# Patient Record
Sex: Female | Born: 1940 | Race: Asian | Hispanic: No | Marital: Married
Health system: Southern US, Community
[De-identification: ages and names within clinical notes are randomized; demographics above are authoritative.]

---

## 2010-12-21 ENCOUNTER — Other Ambulatory Visit: Payer: Self-pay | Admitting: Family Medicine

## 2010-12-21 DIAGNOSIS — Z780 Asymptomatic menopausal state: Secondary | ICD-10-CM

## 2010-12-21 DIAGNOSIS — Z1231 Encounter for screening mammogram for malignant neoplasm of breast: Secondary | ICD-10-CM

## 2010-12-30 ENCOUNTER — Ambulatory Visit
Admission: RE | Admit: 2010-12-30 | Discharge: 2010-12-30 | Disposition: A | Discharge status: Home / Self Care | Payer: Medicare Other | Source: Ambulatory Visit | Attending: Family Medicine | Admitting: Family Medicine

## 2010-12-30 DIAGNOSIS — Z1231 Encounter for screening mammogram for malignant neoplasm of breast: Secondary | ICD-10-CM

## 2010-12-30 DIAGNOSIS — Z780 Asymptomatic menopausal state: Secondary | ICD-10-CM

## 2011-12-14 ENCOUNTER — Other Ambulatory Visit: Payer: Self-pay | Admitting: Family Medicine

## 2011-12-14 DIAGNOSIS — Z1231 Encounter for screening mammogram for malignant neoplasm of breast: Secondary | ICD-10-CM

## 2011-12-31 ENCOUNTER — Ambulatory Visit
Admission: RE | Admit: 2011-12-31 | Discharge: 2011-12-31 | Disposition: A | Discharge status: Home / Self Care | Payer: Medicare Other | Source: Ambulatory Visit | Attending: Family Medicine | Admitting: Family Medicine

## 2011-12-31 DIAGNOSIS — Z1231 Encounter for screening mammogram for malignant neoplasm of breast: Secondary | ICD-10-CM

## 2012-11-29 ENCOUNTER — Other Ambulatory Visit: Payer: Self-pay

## 2012-11-29 DIAGNOSIS — Z1231 Encounter for screening mammogram for malignant neoplasm of breast: Secondary | ICD-10-CM

## 2013-01-01 ENCOUNTER — Ambulatory Visit
Admission: RE | Admit: 2013-01-01 | Discharge: 2013-01-01 | Disposition: A | Discharge status: Home / Self Care | Payer: Medicare Other | Source: Ambulatory Visit

## 2013-01-01 DIAGNOSIS — Z1231 Encounter for screening mammogram for malignant neoplasm of breast: Secondary | ICD-10-CM

## 2013-10-09 ENCOUNTER — Other Ambulatory Visit: Payer: Self-pay | Admitting: Family Medicine

## 2013-10-09 DIAGNOSIS — Z1231 Encounter for screening mammogram for malignant neoplasm of breast: Secondary | ICD-10-CM

## 2013-10-09 DIAGNOSIS — E2839 Other primary ovarian failure: Secondary | ICD-10-CM

## 2014-01-02 ENCOUNTER — Encounter (INDEPENDENT_AMBULATORY_CARE_PROVIDER_SITE_OTHER): Payer: Self-pay

## 2014-01-02 ENCOUNTER — Ambulatory Visit
Admission: RE | Admit: 2014-01-02 | Discharge: 2014-01-02 | Disposition: A | Discharge status: Home / Self Care | Payer: Medicare Other | Source: Ambulatory Visit | Attending: Family Medicine | Admitting: Family Medicine

## 2014-01-02 DIAGNOSIS — Z1231 Encounter for screening mammogram for malignant neoplasm of breast: Secondary | ICD-10-CM

## 2014-01-02 DIAGNOSIS — E2839 Other primary ovarian failure: Secondary | ICD-10-CM

## 2014-12-16 ENCOUNTER — Other Ambulatory Visit: Payer: Self-pay

## 2014-12-16 DIAGNOSIS — Z1231 Encounter for screening mammogram for malignant neoplasm of breast: Secondary | ICD-10-CM

## 2015-01-14 ENCOUNTER — Ambulatory Visit
Admission: RE | Admit: 2015-01-14 | Discharge: 2015-01-14 | Disposition: A | Discharge status: Home / Self Care | Payer: Medicare Other | Source: Ambulatory Visit

## 2015-01-14 DIAGNOSIS — Z1231 Encounter for screening mammogram for malignant neoplasm of breast: Secondary | ICD-10-CM

## 2015-12-16 ENCOUNTER — Other Ambulatory Visit: Payer: Self-pay | Admitting: Family Medicine

## 2015-12-16 DIAGNOSIS — Z139 Encounter for screening, unspecified: Secondary | ICD-10-CM

## 2016-01-19 ENCOUNTER — Ambulatory Visit
Admission: RE | Admit: 2016-01-19 | Discharge: 2016-01-19 | Disposition: A | Discharge status: Home / Self Care | Payer: Medicare Other | Source: Ambulatory Visit | Attending: Family Medicine | Admitting: Family Medicine

## 2016-01-19 DIAGNOSIS — Z139 Encounter for screening, unspecified: Secondary | ICD-10-CM

## 2016-12-28 ENCOUNTER — Other Ambulatory Visit: Payer: Self-pay | Admitting: Family Medicine

## 2016-12-28 DIAGNOSIS — Z1231 Encounter for screening mammogram for malignant neoplasm of breast: Secondary | ICD-10-CM

## 2017-01-19 ENCOUNTER — Ambulatory Visit: Payer: Medicare Other

## 2017-02-01 ENCOUNTER — Ambulatory Visit
Admission: RE | Admit: 2017-02-01 | Discharge: 2017-02-01 | Disposition: A | Discharge status: Home / Self Care | Payer: Medicare Other | Source: Ambulatory Visit | Attending: Family Medicine | Admitting: Family Medicine

## 2017-02-01 DIAGNOSIS — Z1231 Encounter for screening mammogram for malignant neoplasm of breast: Secondary | ICD-10-CM

## 2017-12-22 ENCOUNTER — Ambulatory Visit
Admission: RE | Admit: 2017-12-22 | Discharge: 2017-12-22 | Disposition: A | Discharge status: Home / Self Care | Payer: Medicare Other | Source: Ambulatory Visit | Attending: Family Medicine | Admitting: Family Medicine

## 2017-12-22 ENCOUNTER — Other Ambulatory Visit: Payer: Self-pay | Admitting: Family Medicine

## 2017-12-22 DIAGNOSIS — M170 Bilateral primary osteoarthritis of knee: Secondary | ICD-10-CM

## 2018-03-08 ENCOUNTER — Other Ambulatory Visit: Payer: Self-pay | Admitting: Family Medicine

## 2018-03-08 DIAGNOSIS — Z1231 Encounter for screening mammogram for malignant neoplasm of breast: Secondary | ICD-10-CM

## 2018-03-16 ENCOUNTER — Other Ambulatory Visit: Payer: Self-pay | Admitting: Family Medicine

## 2018-03-16 DIAGNOSIS — E2839 Other primary ovarian failure: Secondary | ICD-10-CM

## 2018-03-16 DIAGNOSIS — R5381 Other malaise: Secondary | ICD-10-CM

## 2018-04-11 ENCOUNTER — Ambulatory Visit
Admission: RE | Admit: 2018-04-11 | Discharge: 2018-04-11 | Disposition: A | Discharge status: Home / Self Care | Payer: Medicare Other | Source: Ambulatory Visit | Attending: Family Medicine | Admitting: Family Medicine

## 2018-04-11 DIAGNOSIS — Z1231 Encounter for screening mammogram for malignant neoplasm of breast: Secondary | ICD-10-CM

## 2018-05-19 ENCOUNTER — Ambulatory Visit
Admission: RE | Admit: 2018-05-19 | Discharge: 2018-05-19 | Disposition: A | Discharge status: Home / Self Care | Payer: Medicare Other | Source: Ambulatory Visit | Attending: Family Medicine | Admitting: Family Medicine

## 2018-05-19 DIAGNOSIS — E2839 Other primary ovarian failure: Secondary | ICD-10-CM

## 2019-08-17 IMAGING — CR DG KNEE COMPLETE 4+V*R*
4 series · 4 of 4 positions shown · non-contrast
Comparison: None.

CLINICAL DATA: BILATERAL medial knee pain, more pain on the RIGHT
for 3 weeks. No history of injury.

EXAM:
RIGHT KNEE - COMPLETE 4+ VIEW

[t knee ap right]
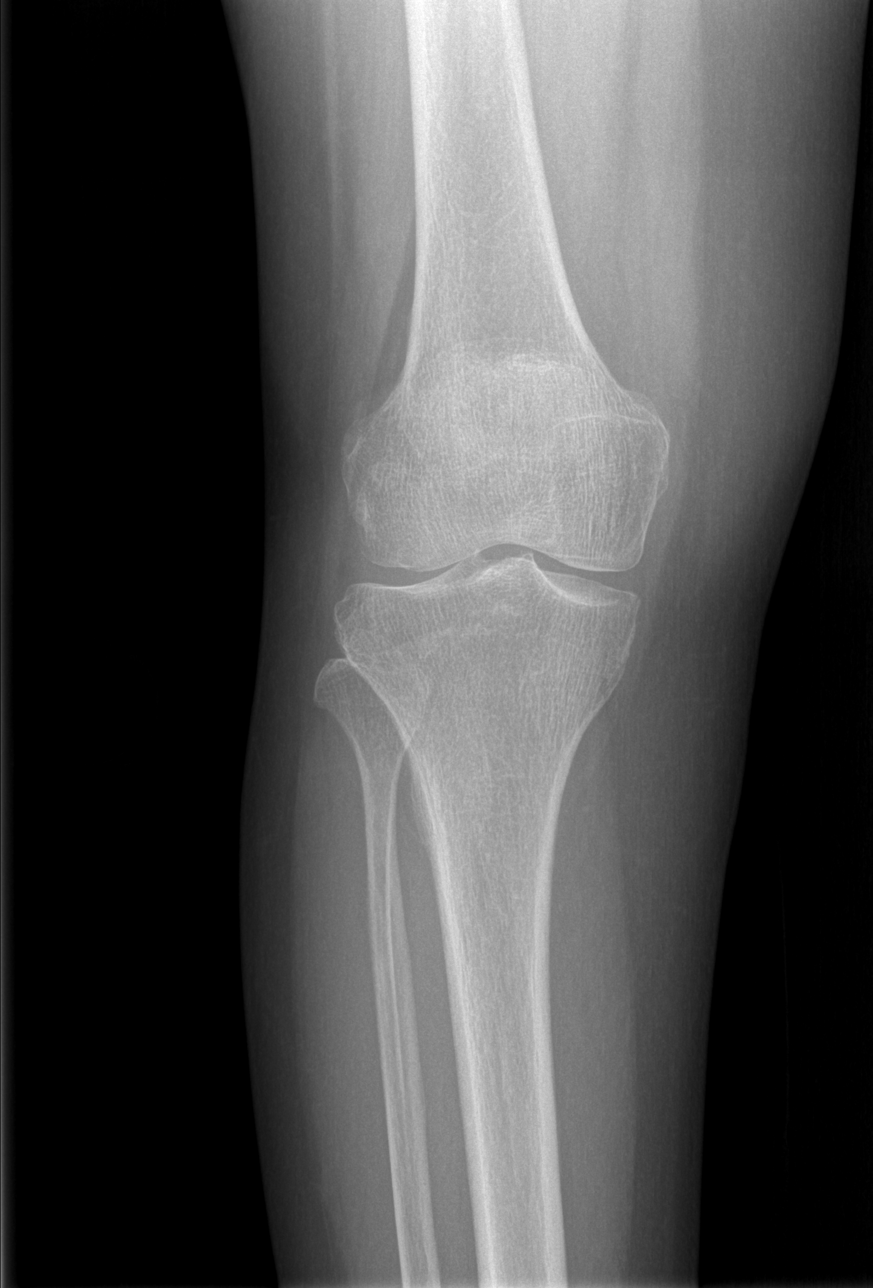

[t knee oblique right (1 of 2)]
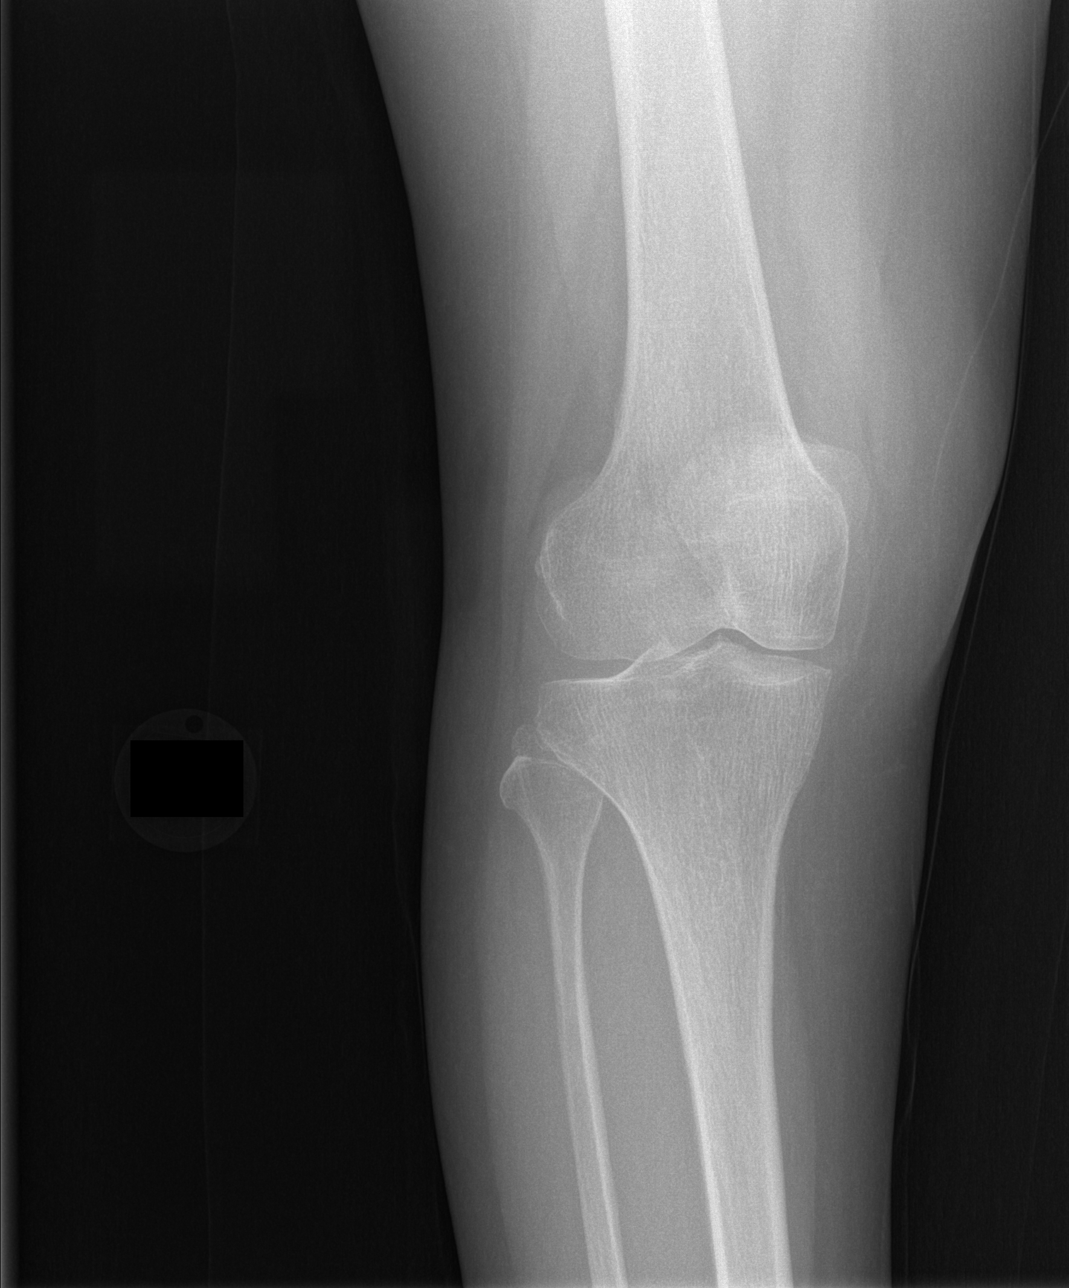

[t knee oblique right (2 of 2)]
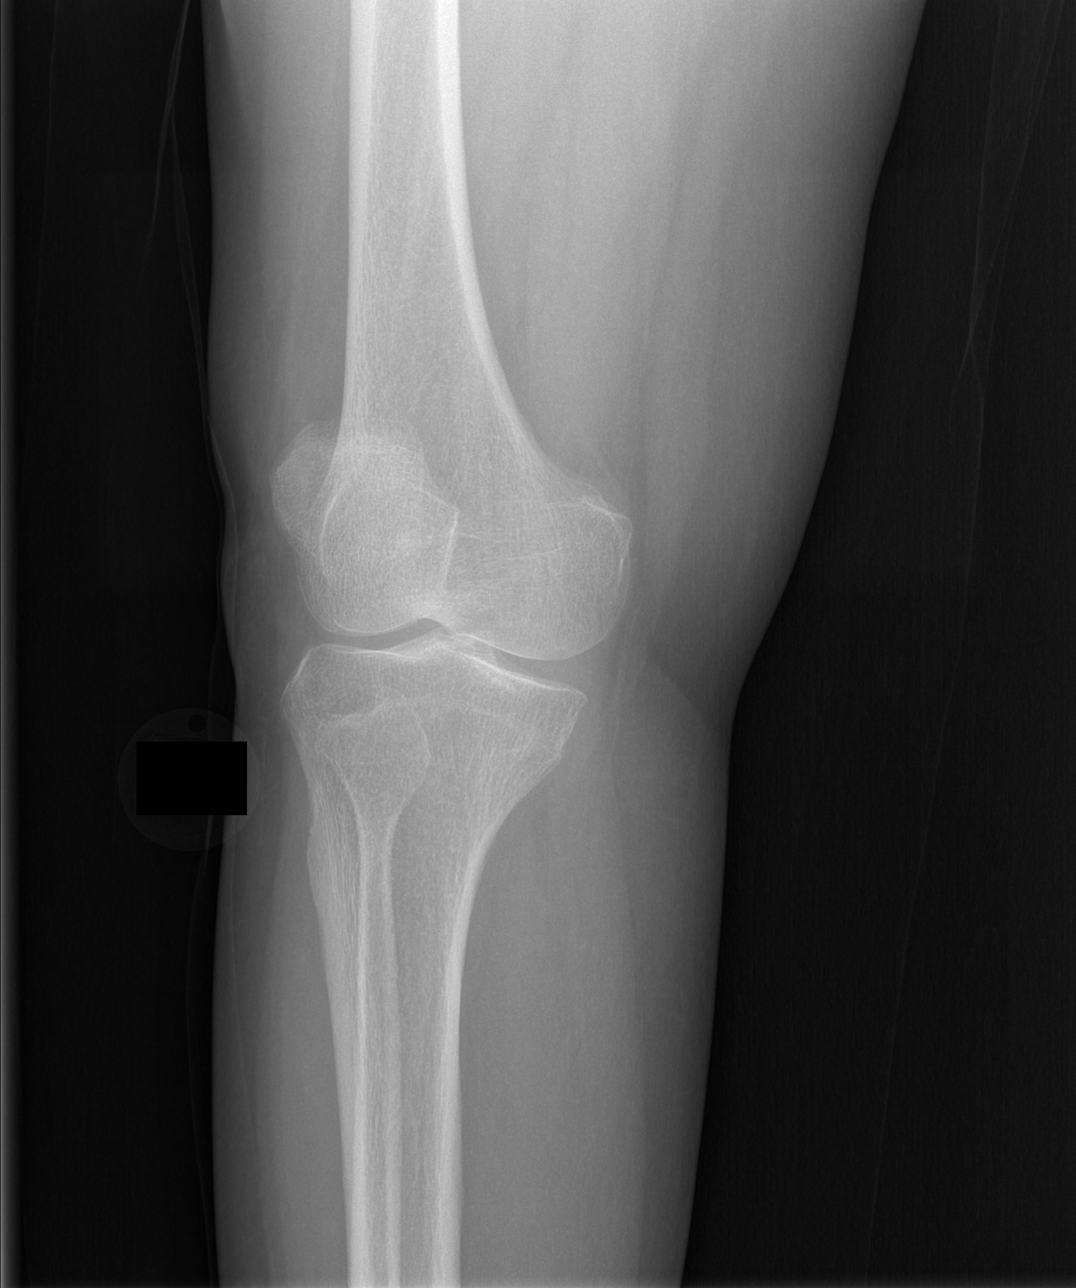

[t knee lat right]
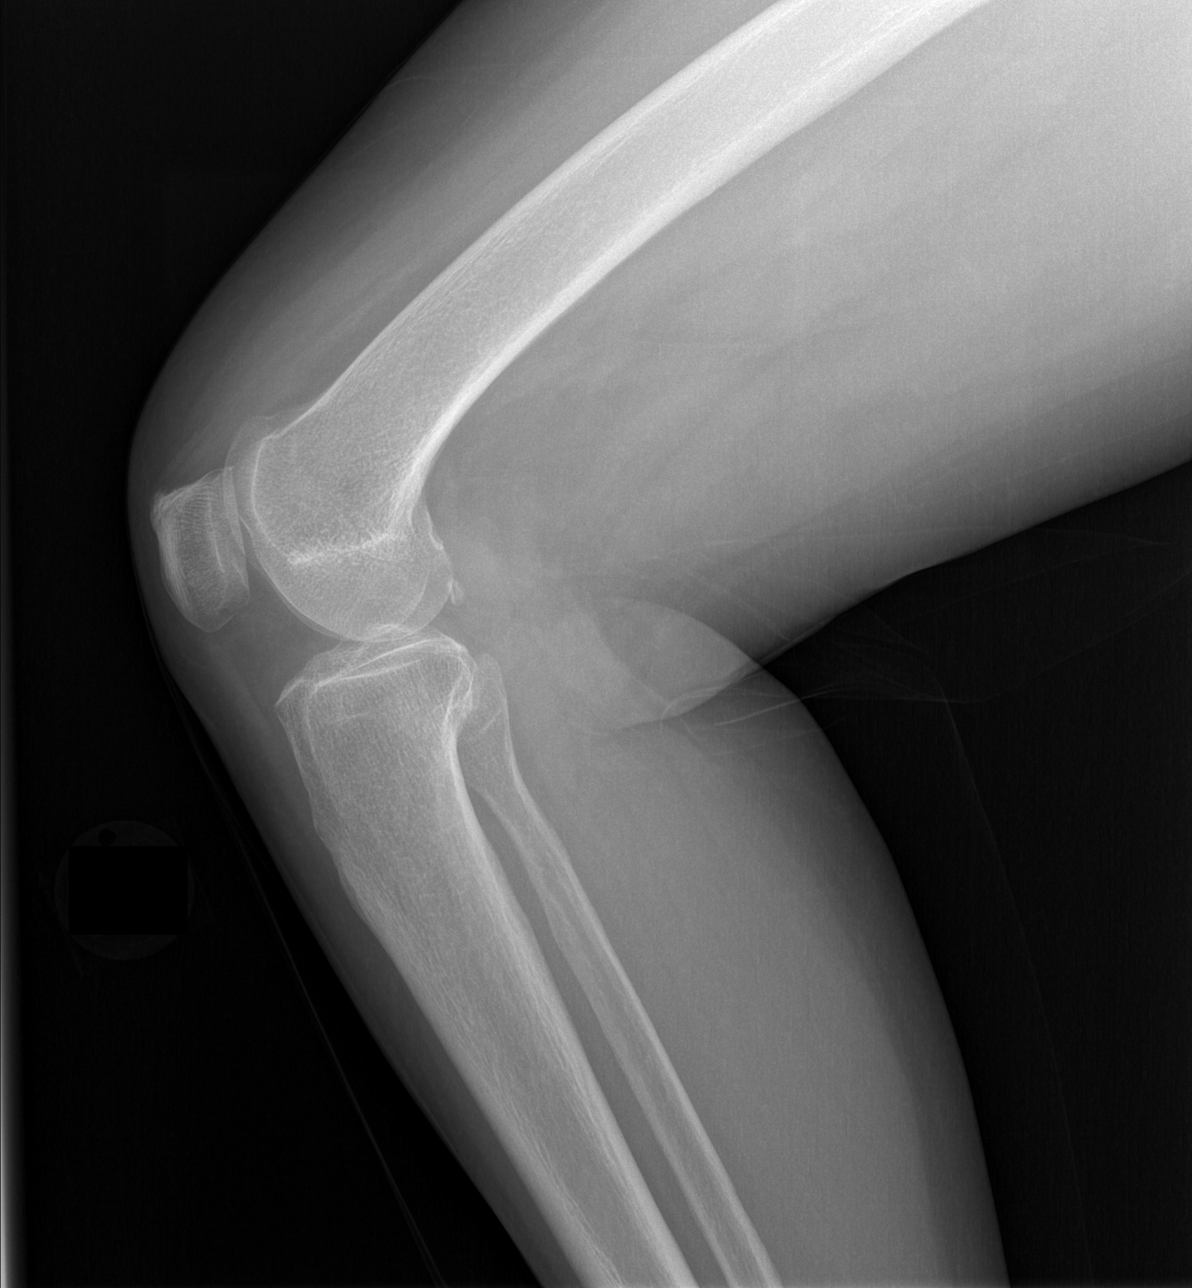

[4 of 4 positions shown; findings below may reference images not displayed]

FINDINGS: No evidence of fracture, dislocation, or joint effusion. Mild
narrowing of the lateral and patellofemoral compartments. Mild
patellar spurring. Soft tissues are unremarkable.
IMPRESSION: RIGHT knee osteoarthritis.

## 2020-03-27 ENCOUNTER — Other Ambulatory Visit: Payer: Self-pay | Admitting: Family Medicine

## 2020-03-27 ENCOUNTER — Other Ambulatory Visit: Payer: Self-pay

## 2020-03-27 ENCOUNTER — Ambulatory Visit
Admission: RE | Admit: 2020-03-27 | Discharge: 2020-03-27 | Disposition: A | Discharge status: Home / Self Care | Payer: Medicare Other | Source: Ambulatory Visit | Attending: Family Medicine | Admitting: Family Medicine

## 2020-03-27 DIAGNOSIS — M25562 Pain in left knee: Secondary | ICD-10-CM

## 2021-11-10 ENCOUNTER — Other Ambulatory Visit: Payer: Self-pay | Admitting: Family Medicine

## 2021-11-10 ENCOUNTER — Ambulatory Visit
Admission: RE | Admit: 2021-11-10 | Discharge: 2021-11-10 | Disposition: A | Discharge status: Home / Self Care | Payer: Medicare Other | Source: Ambulatory Visit | Attending: Family Medicine | Admitting: Family Medicine

## 2021-11-10 DIAGNOSIS — E70319 Ocular albinism, unspecified: Secondary | ICD-10-CM

## 2021-11-10 DIAGNOSIS — M179 Osteoarthritis of knee, unspecified: Secondary | ICD-10-CM

## 2021-11-20 IMAGING — CR DG KNEE COMPLETE 4+V*R*
4 series · 4 of 4 positions shown · non-contrast
Comparison: 12/22/2017.

CLINICAL DATA: Knee pain for 1 month. No injury. Pain with bending.

EXAM:
RIGHT KNEE - COMPLETE 4+ VIEW

[t knee ap right]
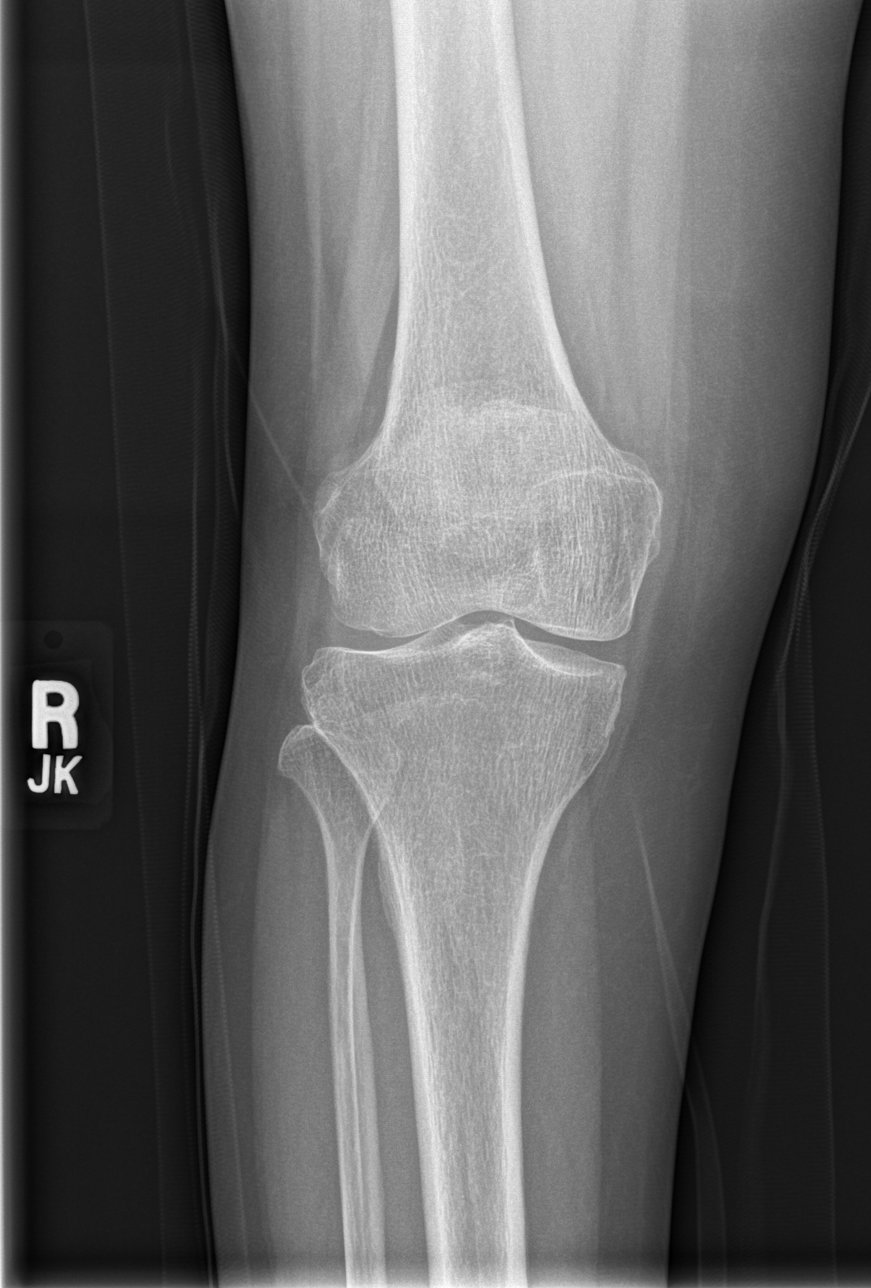

[t knee obl right (1 of 3)]
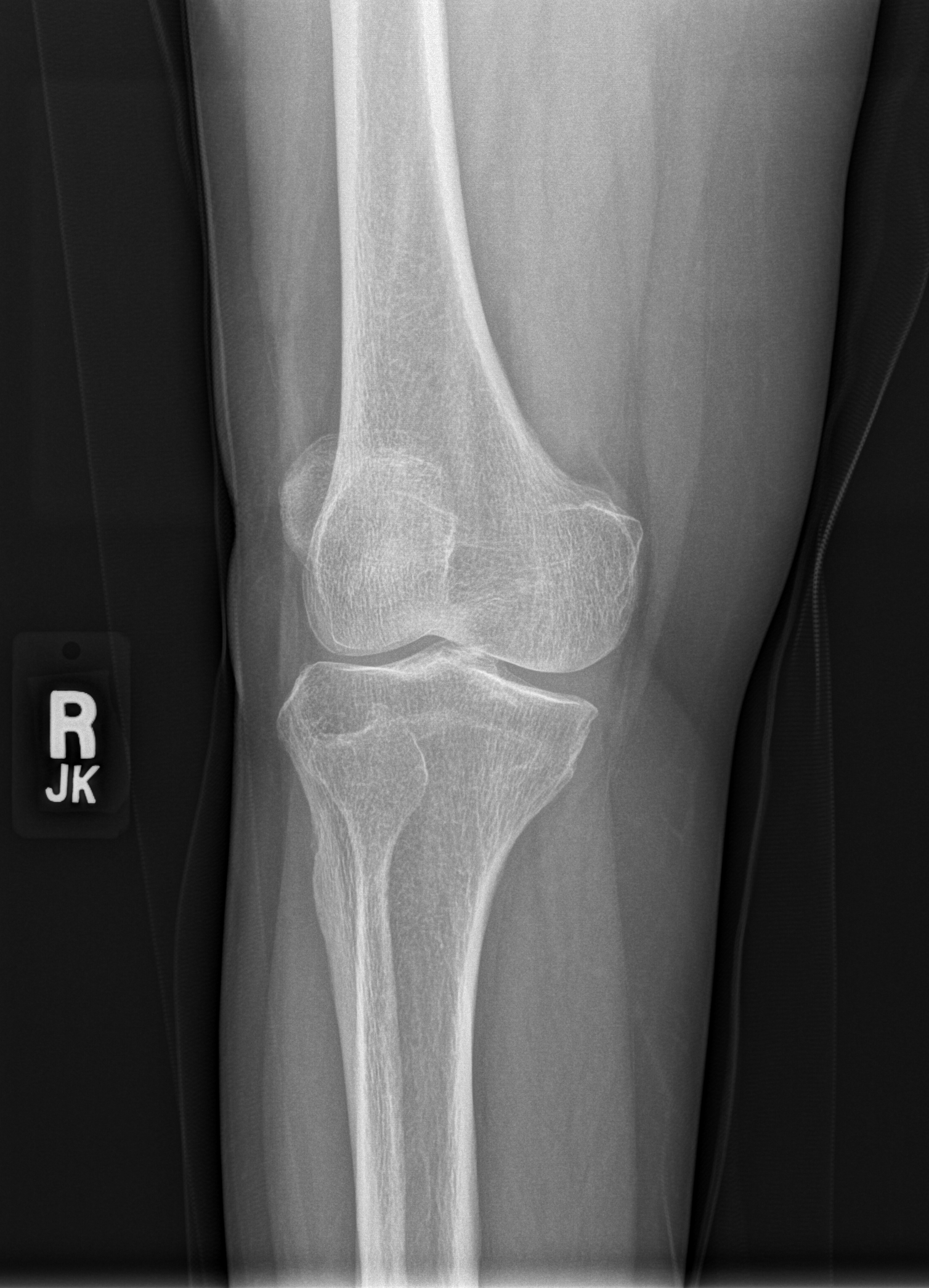

[t knee obl right (2 of 3)]
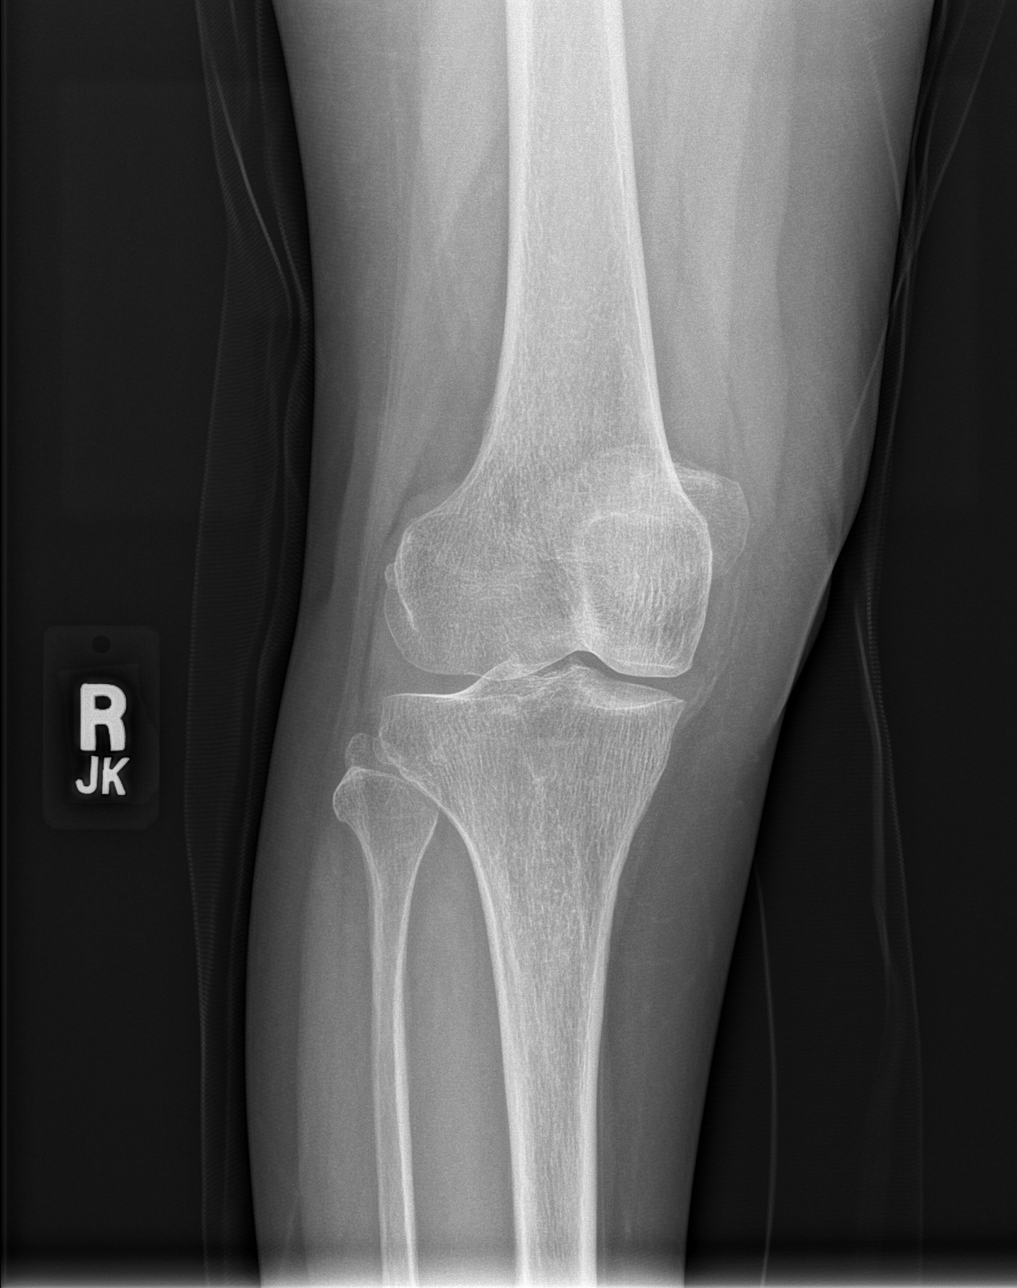

[t knee obl right (3 of 3)]
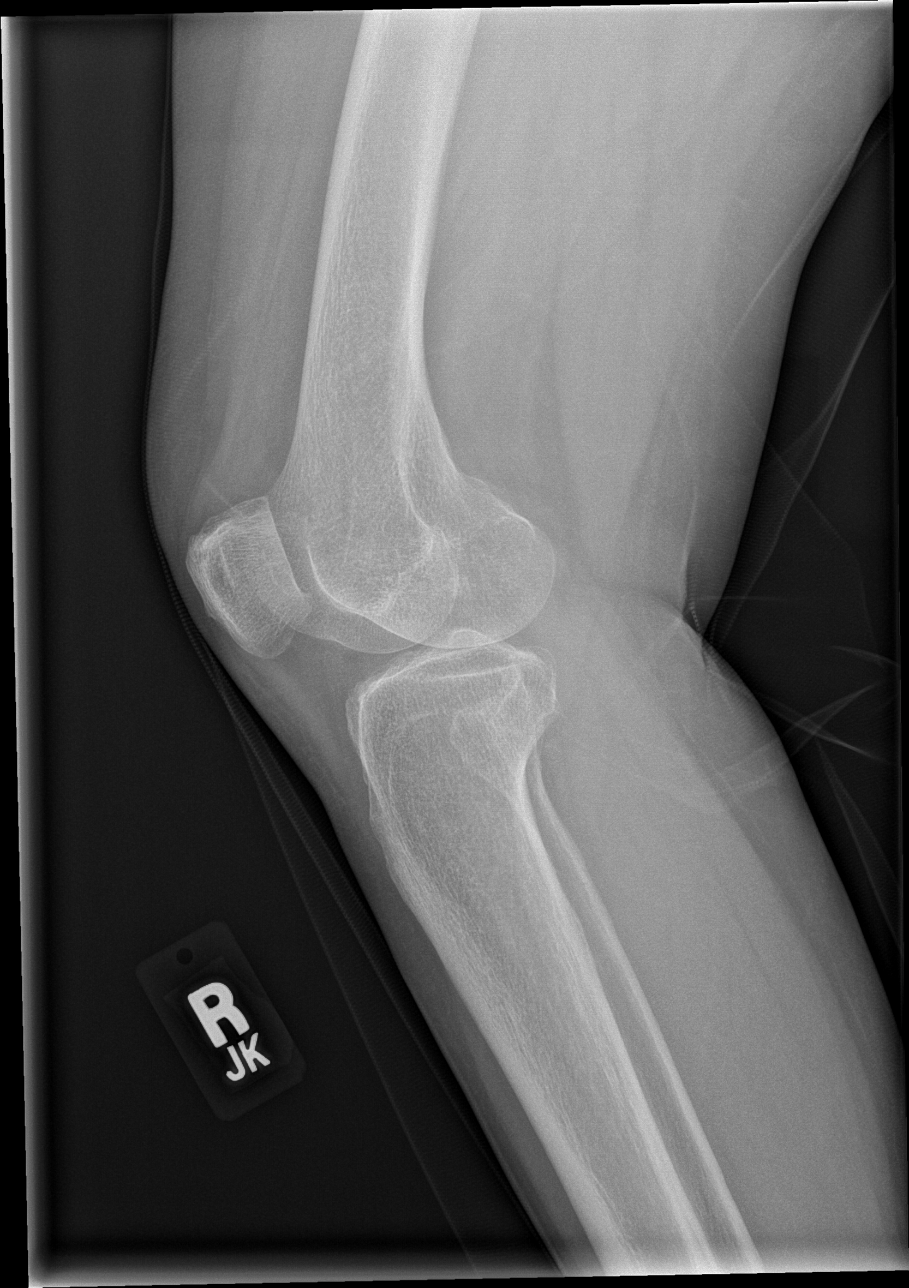

[4 of 4 positions shown; findings below may reference images not displayed]

FINDINGS: No evidence of fracture, dislocation, or joint effusion. No evidence
of arthropathy or other focal bone abnormality. Soft tissues are
unremarkable.
IMPRESSION: Negative.

## 2022-04-02 ENCOUNTER — Other Ambulatory Visit: Payer: Self-pay | Admitting: Family Medicine

## 2022-04-02 DIAGNOSIS — M810 Age-related osteoporosis without current pathological fracture: Secondary | ICD-10-CM

## 2022-09-23 ENCOUNTER — Ambulatory Visit
Admission: RE | Admit: 2022-09-23 | Discharge: 2022-09-23 | Disposition: A | Discharge status: Home / Self Care | Payer: Self-pay | Source: Ambulatory Visit | Attending: Family Medicine | Admitting: Family Medicine

## 2022-09-23 DIAGNOSIS — M810 Age-related osteoporosis without current pathological fracture: Secondary | ICD-10-CM
